# Patient Record
Sex: Female | Born: 1945
Health system: Southern US, Community
[De-identification: ages and names within clinical notes are randomized; demographics above are authoritative.]

## PROBLEM LIST (undated history)

## (undated) DIAGNOSIS — T7840XA Allergy, unspecified, initial encounter: Secondary | ICD-10-CM

## (undated) HISTORY — DX: Allergy, unspecified, initial encounter: T78.40XA

## (undated) HISTORY — PX: TUBAL LIGATION: SHX77

---

## 2013-11-08 ENCOUNTER — Ambulatory Visit (INDEPENDENT_AMBULATORY_CARE_PROVIDER_SITE_OTHER): Payer: Medicare Other | Admitting: Family Medicine

## 2013-11-08 VITALS — BP 130/90 | HR 66 | Temp 98.5°F | Resp 16 | Ht 63.0 in | Wt 117.8 lb

## 2013-11-08 DIAGNOSIS — L02419 Cutaneous abscess of limb, unspecified: Secondary | ICD-10-CM

## 2013-11-08 DIAGNOSIS — L03115 Cellulitis of right lower limb: Secondary | ICD-10-CM

## 2013-11-08 DIAGNOSIS — L03119 Cellulitis of unspecified part of limb: Secondary | ICD-10-CM | POA: Diagnosis not present

## 2013-11-08 MED ORDER — MUPIROCIN 2 % EX OINT: TOPICAL_OINTMENT | CUTANEOUS | Status: DC

## 2013-11-08 NOTE — Progress Notes (Signed)
Chief Complaint:  Chief Complaint  Patient presents with  . Leg Injury    Fell at home on the floor 4 weeks ago, still painful   . Rash    Used Betadine around the wound, a rash appeared afterwards    HPI: Patricia Maynard is a 68 y.o. female who is here for  Right leg wound, fell when she was bringing groceries into her house, and wound has not healed 4 weeks ago. She plucked her leg hair and those places turned "fire red". She has put neosporin.  She has had no fevers or chills. She has been weightbearing. She has no numbness or tingling. No diabetes. No weakness, tingling. Has been walking on it.   Past Medical History  Diagnosis Date  . Allergy    Past Surgical History  Procedure Laterality Date  . Tubal ligation     History   Social History  . Marital Status: Divorced    Spouse Name: N/A    Number of Children: N/A  . Years of Education: N/A   Social History Main Topics  . Smoking status: Current Every Day Smoker  . Smokeless tobacco: None  . Alcohol Use: No  . Drug Use: No  . Sexual Activity: None   Other Topics Concern  . None   Social History Narrative  . None   Family History  Problem Relation Age of Onset  . Hyperlipidemia Mother   . Hypertension Mother   . Stroke Mother    No Known Allergies Prior to Admission medications   Medication Sig Start Date End Date Taking? Authorizing Provider  loratadine (CLARITIN) 10 MG tablet Take 10 mg by mouth daily.   Yes Historical Provider, MD  phenylephrine (SUDAFED PE) 10 MG TABS tablet Take 10 mg by mouth every 4 (four) hours as needed.   Yes Historical Provider, MD     ROS: The patient denies fevers, chills, night sweats, unintentional weight loss, chest pain, palpitations, wheezing, dyspnea on exertion, nausea, vomiting, abdominal pain, dysuria, hematuria, melena, numbness, weakness, or tingling.   All other systems have been reviewed and were otherwise negative with the exception of those mentioned in  the HPI and as above.    PHYSICAL EXAM: Filed Vitals:   11/08/13 1751  BP: 130/90  Pulse: 66  Temp: 98.5 F (36.9 C)  Resp: 16   Filed Vitals:   11/08/13 1751  Height: 5\' 3"  (1.6 m)  Weight: 117 lb 12.8 oz (53.434 kg)   Body mass index is 20.87 kg/(m^2).  General: Alert, no acute distress HEENT:  Normocephalic, atraumatic, oropharynx patent. EOMI, PERRLA Cardiovascular:  Regular rate and rhythm, no rubs murmurs or gallops.  No Carotid bruits, radial pulse intact. No pedal edema.  Respiratory: Clear to auscultation bilaterally.  No wheezes, rales, or rhonchi.  No cyanosis, no use of accessory musculature GI: No organomegaly, abdomen is soft and non-tender, positive bowel sounds.  No masses. Skin: + localized cellulitic rash. Neurologic: Facial musculature symmetric. Psychiatric: Patient is appropriate throughout our interaction. Lymphatic: No cervical lymphadenopathy Musculoskeletal: Gait intact.   LABS: No results found for this or any previous visit.   EKG/XRAY:   Primary read interpreted by Dr. Conley Rolls at [facility].   ASSESSMENT/PLAN: Encounter Diagnosis  Name Primary?  . Cellulitis of right lower extremity Yes   Rx bactroban, Nonadhesive dressing Curlex dressing  No adhesive tape on skin , if worsening symptoms then will call me for oral abx F/u prn   Gross sideeffects,  risk and benefits, and alternatives of medications d/w patient. Patient is aware that all medications have potential sideeffects and we are unable to predict every sideeffect or drug-drug interaction that may occur.  Rakwon Letourneau PHUONG, DO 11/08/2013 7:04 PM

## 2014-01-27 DIAGNOSIS — Z23 Encounter for immunization: Secondary | ICD-10-CM | POA: Diagnosis not present

## 2014-08-02 ENCOUNTER — Ambulatory Visit (INDEPENDENT_AMBULATORY_CARE_PROVIDER_SITE_OTHER): Payer: Medicare Other | Admitting: Emergency Medicine

## 2014-08-02 VITALS — BP 160/100 | HR 88 | Temp 98.0°F | Resp 17 | Ht 63.0 in | Wt 120.0 lb

## 2014-08-02 DIAGNOSIS — K029 Dental caries, unspecified: Secondary | ICD-10-CM

## 2014-08-02 MED ORDER — CEPHALEXIN 500 MG PO CAPS: 500.0000 mg | ORAL_CAPSULE | Freq: Four times a day (QID) | ORAL | Status: DC

## 2014-08-02 NOTE — Progress Notes (Signed)
[facility] [address], [place] [id number] [phone]  Date:  08/02/2014   Name:  Patricia BakeBrenda Maynard   DOB:  1946/04/12   MRN:  [id number]  PCP:  No PCP Per Patient    Chief Complaint: Dental Pain; Hand Injury; and Sinusitis   History of Present Illness:  Patricia Maynard is a 69 y.o. very pleasant female patient who presents with the following:  History of dental extraction of #29 and #4. Had a restoration of adjacent tooth. Now has lost the filling on #4 This occurred this week.  Has pain i mouth and thermal sensitivity Bad taste in mouth No nasal drainage or post nasal drip Ho cough, wheezing or shortness of breath No fever or chills Numerous scratches on both hands.  Now puffy Current on TD No improvement with over the counter medications or other home remedies.  Denies other complaint or health concern today.    There are no active problems to display for this patient.   Past Medical History  Diagnosis Date  . Allergy     Past Surgical History  Procedure Laterality Date  . Tubal ligation      History  Substance Use Topics  . Smoking status: Current Every Day Smoker -- 0.75 packs/day for 50 years    Types: Cigarettes  . Smokeless tobacco: Not on file  . Alcohol Use: No    Family History  Problem Relation Age of Onset  . Hyperlipidemia Mother   . Hypertension Mother   . Stroke Mother     No Known Allergies  Medication list has been reviewed and updated.  Current Outpatient Prescriptions on File Prior to Visit  Medication Sig Dispense Refill  . loratadine (CLARITIN) 10 MG tablet Take 10 mg by mouth daily.    . phenylephrine (SUDAFED PE) 10 MG TABS tablet Take 10 mg by mouth every 4 (four) hours as needed.     No current facility-administered medications on file prior to visit.    Review of Systems:  As per HPI, otherwise negative.    Physical Examination: Filed Vitals:   08/02/14 1513  BP: 160/100  Pulse: 88  Temp: 98  F (36.7 C)  Resp: 17   Filed Vitals:   08/02/14 1513  Height: 5\' 3"  (1.6 m)  Weight: 120 lb (54.432 kg)   Body mass index is 21.26 kg/(m^2). Ideal Body Weight: Weight in (lb) to have BMI = 25: 140.8  GEN: WDWN, NAD, Non-toxic, A & O x 3 HEENT: Atraumatic, Normocephalic. Neck supple. No masses, No LAD.  Badly decayed #4 with lost filling. Ears and Nose: No external deformity. CV: RRR, No M/G/R. No JVD. No thrill. No extra heart sounds. PULM: CTA B, no wheezes, crackles, rhonchi. No retractions. No resp. distress. No accessory muscle use. ABD: S, NT, ND, +BS. No rebound. No HSM. EXTR: No c/c/e NEURO Normal gait.  PSYCH: Normally interactive. Conversant. Not depressed or anxious appearing.  Calm demeanor.    Assessment and Plan: Lost filling Keflex Dentist Monday  Signed,  Phillips OdorJeffery Vanity Larsson, MD

## 2014-08-02 NOTE — Patient Instructions (Signed)
Abscessed Tooth An abscessed tooth is an infection around your tooth. It may be caused by holes or damage to the tooth (cavity) or a dental disease. An abscessed tooth causes mild to very bad pain in and around the tooth. See your dentist right away if you have tooth or gum pain. HOME CARE  Take your medicine as told. Finish it even if you start to feel better.  Do not drive after taking pain medicine.  Rinse your mouth (gargle) often with salt water ( teaspoon salt in 8 ounces of warm water).  Do not apply heat to the outside of your face. GET HELP RIGHT AWAY IF:   You have a temperature by mouth above 102 F (38.9 C), not controlled by medicine.  You have chills and a very bad headache.  You have problems breathing or swallowing.  Your mouth will not open.  You develop puffiness (swelling) on the neck or around the eye.  Your pain is not helped by medicine.  Your pain is getting worse instead of better. MAKE SURE YOU:   Understand these instructions.  Will watch your condition.  Will get help right away if you are not doing well or get worse. Document Released: 09/22/2007 Document Revised: 06/28/2011 Document Reviewed: 07/14/2010 Exit[employer] Patient Information 2015 [employer]. This information is not intended to replace advice given to you by your health care provider. Make sure you discuss any questions you have with your health care provider.  

## 2015-01-05 DIAGNOSIS — Z23 Encounter for immunization: Secondary | ICD-10-CM | POA: Diagnosis not present

## 2015-12-15 DIAGNOSIS — Z23 Encounter for immunization: Secondary | ICD-10-CM | POA: Diagnosis not present

## 2017-01-11 DIAGNOSIS — Z23 Encounter for immunization: Secondary | ICD-10-CM | POA: Diagnosis not present

## 2017-12-20 DIAGNOSIS — Z23 Encounter for immunization: Secondary | ICD-10-CM | POA: Diagnosis not present

## 2018-06-26 ENCOUNTER — Emergency Department (HOSPITAL_COMMUNITY)
Admission: EM | Admit: 2018-06-26 | Discharge: 2018-06-27 | Disposition: A | Discharge status: Home / Self Care | Payer: Medicare Other | Attending: Emergency Medicine | Admitting: Emergency Medicine

## 2018-06-26 ENCOUNTER — Other Ambulatory Visit: Payer: Self-pay

## 2018-06-26 ENCOUNTER — Encounter (HOSPITAL_COMMUNITY): Payer: Self-pay

## 2018-06-26 ENCOUNTER — Emergency Department (HOSPITAL_COMMUNITY): Payer: Medicare Other

## 2018-06-26 DIAGNOSIS — H353132 Nonexudative age-related macular degeneration, bilateral, intermediate dry stage: Secondary | ICD-10-CM | POA: Diagnosis not present

## 2018-06-26 DIAGNOSIS — R030 Elevated blood-pressure reading, without diagnosis of hypertension: Secondary | ICD-10-CM | POA: Diagnosis not present

## 2018-06-26 DIAGNOSIS — F17210 Nicotine dependence, cigarettes, uncomplicated: Secondary | ICD-10-CM | POA: Diagnosis not present

## 2018-06-26 DIAGNOSIS — H532 Diplopia: Secondary | ICD-10-CM | POA: Diagnosis not present

## 2018-06-26 DIAGNOSIS — H4923 Sixth [abducent] nerve palsy, bilateral: Secondary | ICD-10-CM | POA: Diagnosis not present

## 2018-06-26 LAB — COMPREHENSIVE METABOLIC PANEL
ALT: 16 U/L (ref 0–44)
AST: 27 U/L (ref 15–41)
Albumin: 4.1 g/dL (ref 3.5–5.0)
Alkaline Phosphatase: 68 U/L (ref 38–126)
Anion gap: 9 (ref 5–15)
BUN: 25 mg/dL — ABNORMAL HIGH (ref 8–23)
CHLORIDE: 106 mmol/L (ref 98–111)
CO2: 23 mmol/L (ref 22–32)
Calcium: 9.4 mg/dL (ref 8.9–10.3)
Creatinine, Ser: 0.72 mg/dL (ref 0.44–1.00)
GFR calc non Af Amer: >60 mL/min (ref >60)
Glucose, Bld: 118 mg/dL — ABNORMAL HIGH (ref 70–99)
Potassium: 3.9 mmol/L (ref 3.5–5.1)
Sodium: 138 mmol/L (ref 135–145)
Total Bilirubin: 0.8 mg/dL (ref 0.3–1.2)
Total Protein: 6.8 g/dL (ref 6.5–8.1)

## 2018-06-26 LAB — APTT: APTT: 33 s (ref 24–36)

## 2018-06-26 LAB — POCT I-STAT EG7
ACID-BASE EXCESS: 2 mmol/L (ref 0.0–2.0)
Bicarbonate: 25.8 mmol/L (ref 20.0–28.0)
CALCIUM ION: 1.12 mmol/L — AB (ref 1.15–1.40)
HCT: 41 % (ref 36.0–46.0)
Hemoglobin: 13.9 g/dL (ref 12.0–15.0)
O2 SAT: 99 %
PH VEN: 7.473 — AB (ref 7.250–7.430)
Potassium: 4 mmol/L (ref 3.5–5.1)
Sodium: 139 mmol/L (ref 135–145)
TCO2: 27 mmol/L (ref 22–32)
pCO2, Ven: 35.3 mmHg — ABNORMAL LOW (ref 44.0–60.0)
pO2, Ven: 149 mmHg — ABNORMAL HIGH (ref 32.0–45.0)

## 2018-06-26 LAB — CBC
HCT: 41.4 % (ref 36.0–46.0)
Hemoglobin: 14 g/dL (ref 12.0–15.0)
MCH: 32.5 pg (ref 26.0–34.0)
MCHC: 33.8 g/dL (ref 30.0–36.0)
MCV: 96.1 fL (ref 80.0–100.0)
Platelets: 298 10*3/uL (ref 150–400)
RBC: 4.31 MIL/uL (ref 3.87–5.11)
RDW: 12.4 % (ref 11.5–15.5)
WBC: 8.4 10*3/uL (ref 4.0–10.5)
nRBC: 0 % (ref 0.0–0.2)

## 2018-06-26 LAB — DIFFERENTIAL
Abs Immature Granulocytes: 0.01 10*3/uL (ref 0.00–0.07)
BASOS PCT: 1 %
Basophils Absolute: 0 10*3/uL (ref 0.0–0.1)
Eosinophils Absolute: 0.1 10*3/uL (ref 0.0–0.5)
Eosinophils Relative: 1 %
Immature Granulocytes: 0 %
Lymphocytes Relative: 18 %
Lymphs Abs: 1.5 10*3/uL (ref 0.7–4.0)
Monocytes Absolute: 0.6 10*3/uL (ref 0.1–1.0)
Monocytes Relative: 7 %
NEUTROS PCT: 73 %
Neutro Abs: 6.2 10*3/uL (ref 1.7–7.7)

## 2018-06-26 LAB — I-STAT CREATININE, ED: Creatinine, Ser: 0.7 mg/dL (ref 0.44–1.00)

## 2018-06-26 LAB — PROTIME-INR
INR: 1 (ref 0.8–1.2)
Prothrombin Time: 13.4 seconds (ref 11.4–15.2)

## 2018-06-26 NOTE — ED Triage Notes (Signed)
Pt arrives POV for eval of double vision x 3 weeks. Seen at opthamologist office today and MD noted cranial nerve VI deficit which he believed could be attributed to CVA vs MG. Pt sent here for further eval. Pt reports no new deficits in last 2 weeks aside from double vision, neuro intact w/ no unilateral deficits weakness or numbness. GCS 15, denies pain.

## 2018-06-27 ENCOUNTER — Emergency Department (HOSPITAL_COMMUNITY): Payer: Medicare Other

## 2018-06-27 DIAGNOSIS — H532 Diplopia: Secondary | ICD-10-CM | POA: Diagnosis not present

## 2018-06-27 LAB — URINALYSIS, ROUTINE W REFLEX MICROSCOPIC
Bilirubin Urine: NEGATIVE
Glucose, UA: NEGATIVE mg/dL
Hgb urine dipstick: NEGATIVE
Ketones, ur: 5 mg/dL — AB
Nitrite: NEGATIVE
Protein, ur: NEGATIVE mg/dL
Specific Gravity, Urine: 1.029 (ref 1.005–1.030)
pH: 5 (ref 5.0–8.0)

## 2018-06-27 MED ORDER — LORAZEPAM 2 MG/ML IJ SOLN
0.5000 mg | Freq: Once | INTRAMUSCULAR | Status: AC
  Administered 2018-06-27: 0.5 mg via INTRAVENOUS
  Filled 2018-06-27: qty 1

## 2018-06-27 MED ORDER — GADOBUTROL 1 MMOL/ML IV SOLN
6.0000 mL | Freq: Once | INTRAVENOUS | Status: AC | PRN
  Administered 2018-06-27: 6 mL via INTRAVENOUS

## 2018-06-27 NOTE — ED Notes (Signed)
Pt returned from MRI °

## 2018-06-27 NOTE — ED Provider Notes (Signed)
[facility] Emergency Department Provider Note MRN:  [id number]  Arrival date & time: 06/27/18     Chief Complaint   Diplopia   History of Present Illness   Patricia Maynard is a 73 y.o. year-old female with no significant past medical history presenting to the ED with chief complaint of double vision.  1 to 2 weeks of persistent intermittent double vision.  Patient explains that it is present more often than it is not.  Patient has been struggling with worsening memory issues for months to years.  Seen by an optometrist and then an ophthalmologist, reportedly diagnosed with a 6th nerve palsy, sent here for further evaluation.  Patient denies headache, no slurred speech, no increased confusion, no chest pain or shortness of breath, no abdominal pain, no numbness or weakness to the arms or legs.  Review of Systems  A complete 10 system review of systems was obtained and all systems are negative except as noted in the HPI and PMH.   Patient's Health History    Past Medical History:  Diagnosis Date  . Allergy     Past Surgical History:  Procedure Laterality Date  . TUBAL LIGATION      Family History  Problem Relation Age of Onset  . Hyperlipidemia Mother   . Hypertension Mother   . Stroke Mother     Social History   Socioeconomic History  . Marital status: Divorced    Spouse name: Not on file  . Number of children: Not on file  . Years of education: Not on file  . Highest education level: Not on file  Occupational History  . Not on file  Social Needs  . Financial resource strain: Not on file  . Food insecurity:    Worry: Not on file    Inability: Not on file  . Transportation needs:    Medical: Not on file    Non-medical: Not on file  Tobacco Use  . Smoking status: Current Every Day Smoker    Packs/day: 0.75    Years: 50.00    Pack years: 37.50    Types: Cigarettes  Substance and Sexual Activity  . Alcohol use: No    Alcohol/week: 0.0 standard  drinks  . Drug use: No  . Sexual activity: Not on file  Lifestyle  . Physical activity:    Days per week: Not on file    Minutes per session: Not on file  . Stress: Not on file  Relationships  . Social connections:    Talks on phone: Not on file    Gets together: Not on file    Attends religious service: Not on file    Active member of club or organization: Not on file    Attends meetings of clubs or organizations: Not on file    Relationship status: Not on file  . Intimate partner violence:    Fear of current or ex partner: Not on file    Emotionally abused: Not on file    Physically abused: Not on file    Forced sexual activity: Not on file  Other Topics Concern  . Not on file  Social History Narrative  . Not on file     Physical Exam  Vital Signs and Nursing Notes reviewed Vitals:   06/27/18 1219 06/27/18 1230  BP: (!) 158/88   Pulse: 60 63  Resp: 16 14  Temp:    SpO2: 95% 97%    CONSTITUTIONAL: Well-appearing, NAD NEURO:  Alert and oriented  x 3, normal and symmetric strength and sensation, normal coordination, normal extraocular movements, no visual field cuts, no aphasia, no neglect EYES:  eyes equal and reactive ENT/NECK:  no LAD, no JVD CARDIO: Regular rate, well-perfused, normal S1 and S2 PULM:  CTAB no wheezing or rhonchi GI/GU:  normal bowel sounds, non-distended, non-tender MSK/SPINE:  No gross deformities, no edema SKIN:  no rash, atraumatic PSYCH:  Appropriate speech and behavior  Diagnostic and Interventional Summary    EKG Interpretation  Date/Time:  Monday June 26 2018 19:58:38 EDT Ventricular Rate:  87 PR Interval:  142 QRS Duration: 80 QT Interval:  406 QTC Calculation: 488 R Axis:   94 Text Interpretation:  Normal sinus rhythm Rightward axis Nonspecific ST abnormality Abnormal ECG Confirmed by Kennis Carina [phone]) on 06/27/2018 11:52:34 AM      Labs Reviewed  COMPREHENSIVE METABOLIC PANEL - Abnormal; Notable for the following  components:      Result Value   Glucose, Bld 118 (*)    BUN 25 (*)    All other components within normal limits  URINALYSIS, ROUTINE W REFLEX MICROSCOPIC - Abnormal; Notable for the following components:   APPearance HAZY (*)    Ketones, ur 5 (*)    Leukocytes,Ua SMALL (*)    Bacteria, UA RARE (*)    All other components within normal limits  POCT I-STAT EG7 - Abnormal; Notable for the following components:   pH, Ven 7.473 (*)    pCO2, Ven 35.3 (*)    pO2, Ven 149.0 (*)    Calcium, Ion 1.12 (*)    All other components within normal limits  PROTIME-INR  APTT  CBC  DIFFERENTIAL  I-STAT CREATININE, ED    MR Brain W and Wo Contrast  Final Result    CT HEAD WO CONTRAST  Final Result      Medications  LORazepam (ATIVAN) injection 0.5 mg (0.5 mg Intravenous Given 06/27/18 1055)  gadobutrol (GADAVIST) 1 MMOL/ML injection 6 mL (6 mLs Intravenous Contrast Given 06/27/18 1215)     Procedures Critical Care  ED Course and Medical Decision Making  I have reviewed the triage vital signs and the nursing notes.  Pertinent labs & imaging results that were available during my care of the patient were reviewed by me and considered in my medical decision making (see below for details).  Intermittent double vision, question of cranial nerve VI palsy diagnosed by ophthalmology, normal vision and extraocular movements here, CT head noncontrast unremarkable, will consult neurology for recommendations with regard to more imaging.  Clinical Course as of Jun 26 1329  Tue Jun 27, 2018  0919 Neurology recommending MRI with and without contrast with thin cuts through the brainstem.   [MB]    Clinical Course User Index [MB] Sabas Sous, MD    MRI is unremarkable.  Upon reassessment patient continues to have normal neurological exam, asymptomatic during her extended ED visit.  Suspect primary ophthalmologic issue or possibly myasthenia gravis which was suggested by the ophthalmologist.   Referred to neurology for further care.  After the discussed management above, the patient was determined to be safe for discharge.  The patient was in agreement with this plan and all questions regarding their care were answered.  ED return precautions were discussed and the patient will return to the ED with any significant worsening of condition.  Elmer Sow. Pilar Plate, MD [facility] Emergency Medicine [facility] [email or web address]  Final Clinical Impressions(s) / ED Diagnoses     ICD-10-CM  1. Double vision H53.2     ED Discharge Orders    None         Sabas Sous, MD 06/27/18 1332

## 2018-06-27 NOTE — ED Notes (Signed)
Patient transported to MRI 

## 2018-06-27 NOTE — ED Notes (Signed)
Patient verbalizes understanding of discharge instructions. Opportunity for questioning and answers were provided. Pt discharged from ED. 

## 2018-06-27 NOTE — Discharge Instructions (Addendum)
You were evaluated in the Emergency Department and after careful evaluation, we did not find any emergent condition requiring admission or further testing in the hospital.  Your MRI today was very reassuring and did not show any signs of stroke.  Please follow-up with the neurologist for further management.  Please return to the Emergency Department if you experience any worsening of your condition.  We encourage you to follow up with a primary care provider.  Thank you for allowing [place] to be a part of your care.

## 2018-07-12 ENCOUNTER — Other Ambulatory Visit: Payer: Self-pay

## 2018-07-12 ENCOUNTER — Ambulatory Visit (INDEPENDENT_AMBULATORY_CARE_PROVIDER_SITE_OTHER): Payer: Medicare Other | Admitting: Neurology

## 2018-07-12 ENCOUNTER — Encounter: Payer: Self-pay | Admitting: Neurology

## 2018-07-12 ENCOUNTER — Other Ambulatory Visit (INDEPENDENT_AMBULATORY_CARE_PROVIDER_SITE_OTHER): Payer: Medicare Other

## 2018-07-12 VITALS — BP 110/70 | HR 56 | Temp 97.8°F | Ht 63.5 in | Wt 125.0 lb

## 2018-07-12 DIAGNOSIS — F17210 Nicotine dependence, cigarettes, uncomplicated: Secondary | ICD-10-CM

## 2018-07-12 DIAGNOSIS — R413 Other amnesia: Secondary | ICD-10-CM | POA: Diagnosis not present

## 2018-07-12 DIAGNOSIS — R6889 Other general symptoms and signs: Secondary | ICD-10-CM

## 2018-07-12 DIAGNOSIS — H532 Diplopia: Secondary | ICD-10-CM

## 2018-07-12 DIAGNOSIS — R739 Hyperglycemia, unspecified: Secondary | ICD-10-CM | POA: Diagnosis not present

## 2018-07-12 DIAGNOSIS — Z720 Tobacco use: Secondary | ICD-10-CM | POA: Diagnosis not present

## 2018-07-12 DIAGNOSIS — Z131 Encounter for screening for diabetes mellitus: Secondary | ICD-10-CM

## 2018-07-12 LAB — TSH: TSH: 2.45 u[IU]/mL (ref 0.35–4.50)

## 2018-07-12 LAB — VITAMIN B12: Vitamin B-12: 785 pg/mL (ref 211–911)

## 2018-07-12 LAB — HEMOGLOBIN A1C: Hgb A1c MFr Bld: 5.9 % (ref 4.6–6.5)

## 2018-07-12 NOTE — Progress Notes (Signed)
[facility] Neu[facility] Clinic Note - Initial Visit   Date: 07/12/18  Patricia Maynard MRN: [id number] DOB: 1945-08-27   Dear Dr. Pilar Plate:   Thank you for your kind referral of Patricia Maynard for consultation of double vision. Although her history is well known to you, please allow [place] to reiterate it for the purpose of our medical record. The patient was accompanied to the clinic by self.    History of Present Illness: Patricia Maynard is a 73 y.o. right-handed Caucasian female with tobacco abuse presenting for evaluation of double vision and memory loss.   Starting around December 2019, she began having double vision when seeing objects that are in the distance, described by side-by-side images. She does not have double vision when images are close up.  She does not droopiness of the eyes.  No difficulty with swallowing or talking.  There is inconsistency between patient's history and history from her ER visit on 06/27/2018 which reports 2-week history of intermittent double vision.  She was evaluated by optometrist and ophthalmologist and diagnosed with CN VI palsy, which patient denies.  I was able to request records from. Dr. Georges Mouse at [facility] on 3/9 whose evaluated indicates possible bilateral CN VI palsy.  In the ED, she underwent MRI brain which did not reveal structural pathology to explain her symptoms.  She has generalized atrophy of the brain.    She does not have primary care doctor and has not seen anyone in many years. .  She lives alone and claims to manage her own finances and household chores such as cooking, cleaning. Her son states that she has made errors overspending and overdrafts her account, so manages her finances.  He also told my nurse that she lived with her parents at the age of 52 because she could not "keep it together".  She does not take daily medications.  She was driving prior to having her double vision, but has stopped this.        Out-side paper records, electronic medical record, and images have been reviewed where available and summarized as:  MRI brain wwo contrast 06/27/2018: Advanced atrophy and small vessel disease. No acute stroke. No abnormal postcontrast enhancement. No structural abnormality affects the brainstem, skull base, or extracerebral CSF spaces along the course of the sixth cranial nerve. Unremarkable appearing orbits.  CT head 06/26/2018:   1. No acute intracranial abnormality identified. 2. Chronic microvascular ischemic changes and volume loss of the brain. Small chronic appearing infarct in left caudate head.   Past Medical History:  Diagnosis Date   Allergy     Past Surgical History:  Procedure Laterality Date   TUBAL LIGATION       Medications:  Outpatient Encounter Medications as of 07/12/2018  Medication Sig   acetaminophen (TYLENOL) 500 MG tablet Take 500 mg by mouth every 6 (six) hours as needed for mild pain, fever or headache.    loratadine (CLARITIN) 10 MG tablet Take 10 mg by mouth daily.   Multiple Vitamins-Minerals (PRESERVISION AREDS PO) Take by mouth.   No facility-administered encounter medications on file as of 07/12/2018.     Allergies: No Known Allergies  Family History: Family History  Problem Relation Age of Onset   Hyperlipidemia Mother    Hypertension Mother    Stroke Mother     Social History: Social History   Tobacco Use   Smoking status: Current Every Day Smoker    Packs/day: 0.75    Years: 55.00  Pack years: 41.25    Types: Cigarettes   Smokeless tobacco: Never Used  Substance Use Topics   Alcohol use: No    Alcohol/week: 0.0 standard drinks   Drug use: No   Social History   Social History Narrative   Lives alone in a 3 story home.  Has 4 children.  Retired.     Education: college.     Review of Systems:  CONSTITUTIONAL: No fevers, chills, night sweats, or weight loss.   EYES: No visual changes or eye pain ENT: No  hearing changes.  No history of nose bleeds.   RESPIRATORY: No cough, wheezing and shortness of breath.   CARDIOVASCULAR: Negative for chest pain, and palpitations.   GI: Negative for abdominal discomfort, blood in stools or black stools.  No recent change in bowel habits.   GU:  No history of incontinence.   MUSCLOSKELETAL: No history of joint pain or swelling.  No myalgias.   SKIN: Negative for lesions, rash, and itching.   HEMATOLOGY/ONCOLOGY: Negative for prolonged bleeding, bruising easily, and swollen nodes.  No history of cancer.   ENDOCRINE: Negative for cold or heat intolerance, polydipsia or goiter.   PSYCH:  No depression or anxiety symptoms.   NEURO: As Above.   Vital Signs:  BP 110/70    Pulse (!) 56    Temp 97.8 F (36.6 C) (Oral)    Ht 5' 3.5" (1.613 m)    Wt 125 lb (56.7 kg)    SpO2 97%    BMI 21.80 kg/m    General Medical Exam:   General:  Well appearing, comfortable.   Eyes/ENT: see cranial nerve examination.   Neck:   No carotid bruits. Respiratory:  Clear to auscultation, good air entry bilaterally.   Cardiac:  Regular rate and rhythm, no murmur.   Extremities:  No deformities, edema, or skin discoloration.  Skin:  No rashes or lesions.  Neurological Exam: MENTAL STATUS including orientation to time, place, person, recent and remote memory, attention span and concentration, language, and fund of knowledge is fair. Speech is not dysarthric. Montreal Cognitive Assessment  07/12/2018  Visuospatial/ Executive (0/5) 2  Naming (0/3) 3  Attention: Read list of digits (0/2) 2  Attention: Read list of letters (0/1) 0  Attention: Serial 7 subtraction starting at 100 (0/3) 1  Language: Repeat phrase (0/2) 1  Language : Fluency (0/1) 0  Abstraction (0/2) 0  Delayed Recall (0/5) 0  Orientation (0/6) 2  Total 11  Adjusted Score (based on education) 11   CRANIAL NERVES: II:  No visual field defects.  Unremarkable fundi.   III-IV-VI: Pupils equal round and reactive to  light.  Left lateral rectus palsy, otherwise conjugate, extra-ocular eye movements in all directions of gaze.  No nystagmus.  No ptosis at rest or with sustained upgaze.   V:  Normal facial sensation.    VII:  Normal facial symmetry and movements.   VIII:  Normal hearing and vestibular function.   IX-X:  Normal palatal movement.   XI:  Normal shoulder shrug and head rotation.   XII:  Normal tongue strength and range of motion, no deviation or fasciculation.  MOTOR: No fatigability.  No atrophy, fasciculations or abnormal movements.  No pronator drift.   Upper Extremity:  Right  Left  Deltoid  5/5   5/5   Biceps  5/5   5/5   Triceps  5/5   5/5   Infraspinatus 5/5  5/5  Medial pectoralis 5/5  5/5  Wrist extensors  5/5   5/5   Wrist flexors  5/5   5/5   Finger extensors  5/5   5/5   Finger flexors  5/5   5/5   Dorsal interossei  5/5   5/5   Abductor pollicis  5/5   5/5   Tone (Ashworth scale)  0  0   Lower Extremity:  Right  Left  Hip flexors  5/5   5/5   Hip extensors  5/5   5/5   Adductor 5/5  5/5  Abductor 5/5  5/5  Knee flexors  5/5   5/5   Knee extensors  5/5   5/5   Dorsiflexors  5/5   5/5   Plantarflexors  5/5   5/5   Toe extensors  5/5   5/5   Toe flexors  5/5   5/5   Tone (Ashworth scale)  0  0   MSRs:  Right        Left                  brachioradialis 2+  2+  biceps 2+  2+  triceps 2+  2+  patellar 2+  2+  ankle jerk 2+  2+  Hoffman no  no  plantar response down  down   SENSORY:  Normal and symmetric perception of light touch, pinprick, vibration.  COORDINATION/GAIT: Normal finger-to- nose-finger.  Intact rapid alternating movements bilaterally.  Able to rise from a chair without using arms.  Gait narrow based and stable.   IMPRESSION: 1.  Left abducens nerve palsy.  She tracks well with right gaze and I cannot appreciate lateral rectus weakness on the right.  MRI brain does not disclose structural pathology to explain this.  I will check labs to determine  whether this is due to myasthenia gravis, thyroid dysfunction, or diabetes.  If antibodies are positive, I will offer trial of mestinon.   2.  Cognitive impairment with poor performance on cognitive screening (11/30). It seems like she may also have some underlying behavior issues, raising concern for frontotemporal dementia.   MRI brain showed generalized atrophy, without preferential involvement of frontal/temporal lobes. Recommend formal neurocognitive testing to better characterize these changes.    3. Tobacco abuse.  She is smoking 0.75 PPD x 55 years.  She does not express interest in quitting.  Patient was informed of the dangers of tobacco abuse including stroke, cancer, and MI, as well as benefits of tobacco cessation.  Approximately 5 mins were spent counseling patient cessation techniques. We discussed various methods to help quit smoking, including deciding on a date to quit, joining a support group, pharmacological agents- nicotine gum/patch/lozenges, chantix. I will reassess her progress at the next follow-up visit   PLAN/RECOMMENDATIONS:  Check myasthenia gravis panel, TSH, HbA1c Formal neurocognitive testing Counseled on tobacco cessation  Establish care with PCP to maintain preventative care  Further recommendations pending results.   Thank you for allowing me to participate in patient's care.  If I can answer any additional questions, I would be pleased to do so.    Sincerely,    Eria Lozoya K. Allena Katz, DO

## 2018-07-12 NOTE — Patient Instructions (Addendum)
Check labs.  We will call you with the results  It is very important that you stop smoking  Please establish care with primary care doctor for regular preventative medical care

## 2018-07-18 LAB — MYASTHENIA GRAVIS PANEL 2
A CHR BINDING ABS: <0.3 nmol/L
ACHR Blocking Abs: <15 % Inhibition (ref <15)
Acetylchol Modul Ab: 20 % Inhibition

## 2018-07-20 ENCOUNTER — Other Ambulatory Visit: Payer: Self-pay | Admitting: *Deleted

## 2018-07-20 DIAGNOSIS — R413 Other amnesia: Secondary | ICD-10-CM

## 2018-07-21 ENCOUNTER — Telehealth: Payer: Self-pay | Admitting: *Deleted

## 2018-07-21 NOTE — Telephone Encounter (Signed)
-----   Message from Glendale Chard, DO sent at 07/20/2018 11:32 AM EDT ----- Please inform patient that her labs are normal.  Recommend follow-up with eye doctor for prisms to help with double vision.  Also, please refer patient for neurocognitive testing for evaluation of memory complaints.  She will need her son to drive her to [place] or she can wait until we are scheduling here in late summer. Thanks

## 2018-07-21 NOTE — Telephone Encounter (Signed)
Left message for patient's son to call me back.

## 2018-07-26 ENCOUNTER — Other Ambulatory Visit: Payer: Self-pay | Admitting: *Deleted

## 2018-07-26 ENCOUNTER — Telehealth: Payer: Self-pay | Admitting: *Deleted

## 2018-07-26 ENCOUNTER — Encounter: Payer: Self-pay | Admitting: *Deleted

## 2018-07-26 DIAGNOSIS — R413 Other amnesia: Secondary | ICD-10-CM

## 2018-07-26 NOTE — Progress Notes (Unsigned)
amg

## 2018-07-26 NOTE — Telephone Encounter (Signed)
-----   Message from Glendale Chard, DO sent at 07/20/2018 11:32 AM EDT ----- Please inform patient that her labs are normal.  Recommend follow-up with eye doctor for prisms to help with double vision.  Also, please refer patient for neurocognitive testing for evaluation of memory complaints.  She will need her son to drive her to [place] or she can wait until we are scheduling here in late summer. Thanks

## 2018-07-26 NOTE — Telephone Encounter (Signed)
No reply from patient.  I will send a letter.

## 2018-08-29 ENCOUNTER — Telehealth: Payer: Self-pay | Admitting: Neurology

## 2018-08-29 NOTE — Telephone Encounter (Signed)
Son called wanting to get test results and waiting on various referrals for treatments. Please Call. Thanks

## 2018-08-30 NOTE — Telephone Encounter (Signed)
If this patient's son calls back will you please relay the message in the last telephone note.

## 2018-08-30 NOTE — Telephone Encounter (Signed)
Left message for patient's son to call me back.

## 2019-06-11 ENCOUNTER — Encounter: Payer: Medicare Other | Admitting: Psychology

## 2019-08-30 DIAGNOSIS — S20212A Contusion of left front wall of thorax, initial encounter: Secondary | ICD-10-CM | POA: Diagnosis not present

## 2019-08-30 DIAGNOSIS — I499 Cardiac arrhythmia, unspecified: Secondary | ICD-10-CM | POA: Diagnosis not present

## 2019-10-31 ENCOUNTER — Other Ambulatory Visit: Payer: Self-pay

## 2019-10-31 ENCOUNTER — Encounter: Payer: Self-pay | Admitting: Physician Assistant

## 2019-10-31 ENCOUNTER — Ambulatory Visit (INDEPENDENT_AMBULATORY_CARE_PROVIDER_SITE_OTHER): Payer: Medicare Other | Admitting: Physician Assistant

## 2019-10-31 VITALS — BP 110/80 | HR 64 | Temp 98.3°F | Resp 14 | Ht 61.0 in | Wt 127.0 lb

## 2019-10-31 DIAGNOSIS — F0390 Unspecified dementia without behavioral disturbance: Secondary | ICD-10-CM

## 2019-10-31 DIAGNOSIS — Z8639 Personal history of other endocrine, nutritional and metabolic disease: Secondary | ICD-10-CM | POA: Diagnosis not present

## 2019-10-31 NOTE — Patient Instructions (Addendum)
Please go to the lab today for blood work.  I will call you with your results. We will alter treatment regimen(s) if indicated by your results.   We can decide on next steps once we get those results. I do want to set her back up with neurology for further monitoring of this progressive dementia.  I would recommend we consider starting Aricept and possibly namenda as well to help try to slow down progression as much as possible. Please talk about this with your husband and brother-in-law so we can get things started.   It was very nice meeting you today. Welcome to [facility]!

## 2019-10-31 NOTE — Progress Notes (Signed)
Patient presents to clinic today with her family to establish care. Note it has been years since she has had a primary care provider. Note that patient started having mild issues with memory a few years ago that have progressed, especially over the past year. Patient is typically oriented to herself and family members but otherwise does not know the day, year, of where she is at. Notes seeing neurology a few years ago when things were starting but did not have follow-up. As such no formal diagnosis or treatment was started. Daughter notes family history of alzheimer's. Note patient does live at home but has a daytime home health aid and the children rotate spending evenings/nights with her. Has not driven in 2 years -- she has not current access to automobile. She does not cook. They have gotten her an ID bracelet and have planned on getting her moved into a memory care unit. Patient and family deny any known history of CAD, MI, CVA. Deny any known history of HTN or DM. Think maybe she had cholesterol at one point in time. They are unsure of her preventive care -- unknown if she has had colonoscopy, mammogram, etc in the past.   Past Medical History:  Diagnosis Date  . Allergy     Past Surgical History:  Procedure Laterality Date  . TUBAL LIGATION      No current outpatient medications on file prior to visit.   No current facility-administered medications on file prior to visit.    No Known Allergies  Family History  Problem Relation Age of Onset  . Hyperlipidemia Mother   . Hypertension Mother   . Stroke Mother     Social History   Socioeconomic History  . Marital status: Divorced    Spouse name: Not on file  . Number of children: 4  . Years of education: 16  . Highest education level: Bachelor's degree (e.g., BA, AB, BS)  Occupational History  . Occupation: retired  Tobacco Use  . Smoking status: Former Smoker    Packs/day: 0.75    Years: 55.00    Pack years: 41.25     Types: Cigarettes  . Smokeless tobacco: Never Used  Vaping Use  . Vaping Use: Never used  Substance and Sexual Activity  . Alcohol use: No    Alcohol/week: 0.0 standard drinks  . Drug use: No  . Sexual activity: Not Currently  Other Topics Concern  . Not on file  Social History Narrative   Lives alone in a 3 story home.  Has 4 children.  Retired.     Education: college.    Social Determinants of Health   Financial Resource Strain:   . Difficulty of Paying Living Expenses:   Food Insecurity:   . Worried About Charity fundraiser in the Last Year:   . Arboriculturist in the Last Year:   Transportation Needs:   . Film/video editor (Medical):   Marland Kitchen Lack of Transportation (Non-Medical):   Physical Activity:   . Days of Exercise per Week:   . Minutes of Exercise per Session:   Stress:   . Feeling of Stress :   Social Connections:   . Frequency of Communication with Friends and Family:   . Frequency of Social Gatherings with Friends and Family:   . Attends Religious Services:   . Active Member of Clubs or Organizations:   . Attends Archivist Meetings:   Marland Kitchen Marital Status:   Intimate Production manager  Violence:   . Fear of Current or Ex-Partner:   . Emotionally Abused:   Marland Kitchen Physically Abused:   . Sexually Abused:    ROS Unable to obtain ROS due to dementia.   BP 110/80   Pulse 64   Temp 98.3 F (36.8 C) (Temporal)   Resp 14   Ht _0  (1.549 m)   Wt 127 lb (57.6 kg)   SpO2 98%   BMI 24.00 kg/m   Physical Exam Vitals reviewed.  Constitutional:      Appearance: Normal appearance.  HENT:     Head: Normocephalic and atraumatic.     Right Ear: Tympanic membrane normal.     Left Ear: Tympanic membrane normal.  Eyes:     Conjunctiva/sclera: Conjunctivae normal.     Pupils: Pupils are equal, round, and reactive to light.     Comments: Arcus senilus present bilaterally   Cardiovascular:     Rate and Rhythm: Normal rate and regular rhythm.     Pulses: Normal  pulses.     Heart sounds: Normal heart sounds.  Pulmonary:     Effort: Pulmonary effort is normal.     Breath sounds: Normal breath sounds.  Abdominal:     General: Bowel sounds are normal. There is no distension.     Palpations: Abdomen is soft. There is no mass.     Tenderness: There is no abdominal tenderness.     Hernia: No hernia is present.  Musculoskeletal:        General: Normal range of motion.     Cervical back: Neck supple.  Neurological:     Mental Status: She is alert.     Cranial Nerves: No cranial nerve deficit.     Motor: No weakness.     Coordination: Coordination normal.     Gait: Gait normal.     Deep Tendon Reflexes: Reflexes normal.     Comments: Alert to person but not place or time.  Very pleasantly demented. Sometimes responds very appropriately. Other times has tangential speech.  Mood is normal. Interacts appropriately with family.     Assessment/Plan: 1. Dementia without behavioral disturbance, unspecified dementia type ([place]) Advancing. Discussed need for Neurology monitoring. Will check baseline labs today. Would like to start Aricept but they want to discuss first. Exam overall unremarkable today except for memory. Referral to Neurology placed -- seen [facility] neuro in the past and can be scheduled there.  Can help with Fl2 forms for SNF if they are wanting to proceed with this before her neuro appt.  - CBC w/Diff - Comp Met (CMET) - B12 - TSH  2. History of hyperlipidemia Will check lipids panel today at family's preference for baseline. Good diet overall. Would not start statin giving advanced dementia.  - Lipid Profile  This visit occurred during the SARS-CoV-2 public health emergency.  Safety protocols were in place, including screening questions prior to the visit, additional usage of staff PPE, and extensive cleaning of exam room while observing appropriate contact time as indicated for disinfecting solutions.      Leeanne Rio, PA-C

## 2019-11-01 LAB — CBC WITH DIFFERENTIAL/PLATELET
Basophils Absolute: 0.1 10*3/uL (ref 0.0–0.1)
Basophils Relative: 1.4 % (ref 0.0–3.0)
Eosinophils Absolute: 0.2 10*3/uL (ref 0.0–0.7)
Eosinophils Relative: 2.5 % (ref 0.0–5.0)
HCT: 40.2 % (ref 36.0–46.0)
Hemoglobin: 13.5 g/dL (ref 12.0–15.0)
Lymphocytes Relative: 18.1 % (ref 12.0–46.0)
Lymphs Abs: 1.2 10*3/uL (ref 0.7–4.0)
MCHC: 33.6 g/dL (ref 30.0–36.0)
MCV: 96.9 fl (ref 78.0–100.0)
Monocytes Absolute: 0.7 10*3/uL (ref 0.1–1.0)
Monocytes Relative: 10.4 % (ref 3.0–12.0)
Neutro Abs: 4.5 10*3/uL (ref 1.4–7.7)
Neutrophils Relative %: 67.6 % (ref 43.0–77.0)
Platelets: 253 10*3/uL (ref 150.0–400.0)
RBC: 4.14 Mil/uL (ref 3.87–5.11)
RDW: 12.8 % (ref 11.5–15.5)
WBC: 6.7 10*3/uL (ref 4.0–10.5)

## 2019-11-01 LAB — COMPREHENSIVE METABOLIC PANEL
ALT: 16 U/L (ref 0–35)
AST: 23 U/L (ref 0–37)
Albumin: 3.8 g/dL (ref 3.5–5.2)
Alkaline Phosphatase: 114 U/L (ref 39–117)
BUN: 15 mg/dL (ref 6–23)
CO2: 30 mEq/L (ref 19–32)
Calcium: 9.1 mg/dL (ref 8.4–10.5)
Chloride: 104 mEq/L (ref 96–112)
Creatinine, Ser: 0.85 mg/dL (ref 0.40–1.20)
GFR: 65.34 mL/min (ref >60.00)
Glucose, Bld: 81 mg/dL (ref 70–99)
Potassium: 4.1 mEq/L (ref 3.5–5.1)
Sodium: 142 mEq/L (ref 135–145)
Total Bilirubin: 0.4 mg/dL (ref 0.2–1.2)
Total Protein: 6.2 g/dL (ref 6.0–8.3)

## 2019-11-01 LAB — LIPID PANEL
Cholesterol: 216 mg/dL — ABNORMAL HIGH (ref 0–200)
HDL: 90.2 mg/dL (ref >39.00)
LDL Cholesterol: 107 mg/dL — ABNORMAL HIGH (ref 0–99)
NonHDL: 126.22
Total CHOL/HDL Ratio: 2
Triglycerides: 94 mg/dL (ref 0.0–149.0)
VLDL: 18.8 mg/dL (ref 0.0–40.0)

## 2019-11-01 LAB — VITAMIN B12: Vitamin B-12: 369 pg/mL (ref 211–911)

## 2019-11-01 LAB — TSH: TSH: 3.1 u[IU]/mL (ref 0.35–4.50)

## 2019-11-12 ENCOUNTER — Telehealth: Payer: Self-pay

## 2019-11-12 DIAGNOSIS — Z111 Encounter for screening for respiratory tuberculosis: Secondary | ICD-10-CM

## 2019-11-12 NOTE — Telephone Encounter (Signed)
FYI, Patient's son, Rob Youth worker, called to inquire about FL2 forms. He states the patient has gotten worse wandering the neighborhood and getting lost as well as locking herself out of the house. He has reached out to two different places and is awaiting a response for placement.

## 2019-11-13 ENCOUNTER — Telehealth: Payer: Self-pay | Admitting: Physician Assistant

## 2019-11-13 NOTE — Telephone Encounter (Signed)
Marrion Coy called [phone] concerning his mom - Patricia Maynard - Please call

## 2019-11-13 NOTE — Telephone Encounter (Signed)
I have completed the FL2 form. They need to get placement so we can fax over to the facility.

## 2019-11-13 NOTE — Telephone Encounter (Signed)
Left a vm message for the patient's son, Marrion Coy, to call the office back in reference to [facility] forms.

## 2019-11-13 NOTE — Telephone Encounter (Signed)
Left a vm message for the patient's son to call the office back.

## 2019-11-14 NOTE — Telephone Encounter (Signed)
Made in error

## 2019-11-14 NOTE — Telephone Encounter (Signed)
Spoke with patient's son, Marrion Coy. The name of the nursing home is: [facility]. Address: [address] [place] [address]. Phone # [phone], Fax# [phone]. ATTNAddison Bailey. Patient's son also needs an order for a chest xray to rule out TB. Chest xray needs to be performed within 48 hours of patient being admitted to nursing home.

## 2019-11-14 NOTE — Telephone Encounter (Signed)
Please advise 

## 2019-11-14 NOTE — Telephone Encounter (Signed)
Spoke with Marrion Coy, patient's son. He will call back this afternoon with the information on where to fax the Fl2 form and who to attention it too.

## 2019-11-15 NOTE — Telephone Encounter (Signed)
CXR ordered and FL2 form faxed to facility

## 2019-11-15 NOTE — Addendum Note (Signed)
Addended by: Con Memos on: 11/15/2019 09:55 AM   Modules accepted: Orders

## 2019-11-15 NOTE — Telephone Encounter (Signed)
Spoke with the patient's son, Marrion Coy and informed him that orders for chest xray were put into the system. He is aware and will be taking Mrs. Nack to [facility] to get her xray done.

## 2019-11-15 NOTE — Telephone Encounter (Signed)
Ok to place order for CXR -- can be done at [facility] or [facility], whichever is more convenient for patient and family.  Ok to fax completed FL2 forms to the requested facility.

## 2019-11-16 ENCOUNTER — Other Ambulatory Visit: Payer: Self-pay

## 2019-11-16 ENCOUNTER — Ambulatory Visit (INDEPENDENT_AMBULATORY_CARE_PROVIDER_SITE_OTHER)
Admission: RE | Admit: 2019-11-16 | Discharge: 2019-11-16 | Disposition: A | Discharge status: Home / Self Care | Payer: Medicare Other | Source: Ambulatory Visit | Attending: Physician Assistant | Admitting: Physician Assistant

## 2019-11-16 ENCOUNTER — Telehealth: Payer: Self-pay

## 2019-11-16 DIAGNOSIS — Z0289 Encounter for other administrative examinations: Secondary | ICD-10-CM | POA: Diagnosis not present

## 2019-11-16 DIAGNOSIS — J449 Chronic obstructive pulmonary disease, unspecified: Secondary | ICD-10-CM | POA: Diagnosis not present

## 2019-11-16 DIAGNOSIS — Z111 Encounter for screening for respiratory tuberculosis: Secondary | ICD-10-CM

## 2019-11-16 NOTE — Telephone Encounter (Signed)
Took call from patient's son, Marrion Coy. He states he has taken his mother to get the chest xray and everything went good. He just needs for it to be faxed to [phone] Attn: Ocie Cornfield. He states it must say Bilateral reading, patient is free of tuberculosis. He states he is trying to get her into the home by Tuesday.

## 2019-11-19 ENCOUNTER — Ambulatory Visit: Payer: Medicare Other | Admitting: Neurology

## 2019-11-19 NOTE — Telephone Encounter (Signed)
Called the facility at [phone] to speak with Patricia Maynard to make sure he received the chest xray. I was informed that he was not in the office today and was transferred to [employer], the resident intake coordinator. I was told that this information would have gone to her. Left a vm message for her to call back and let me know that they did receive the xray.

## 2019-11-19 NOTE — Telephone Encounter (Signed)
Pt called in checking the status to on the results for the TB and the chest xray. He states that he is trying to get his mom into a nursing home as soon as tomorrow. Please call [phone] with an update.

## 2019-11-19 NOTE — Telephone Encounter (Signed)
Spoke with the patient's son, he will continue to reach out to the SNF to make sure they did receive the xray.

## 2019-11-19 NOTE — Telephone Encounter (Signed)
Chest xray results faxed to Ocie Cornfield at the home

## 2019-11-19 NOTE — Telephone Encounter (Signed)
Results are in -- see result note. Please fax to requested number.

## 2019-11-21 DIAGNOSIS — F411 Generalized anxiety disorder: Secondary | ICD-10-CM | POA: Diagnosis not present

## 2019-11-21 DIAGNOSIS — B372 Candidiasis of skin and nail: Secondary | ICD-10-CM | POA: Diagnosis not present

## 2019-11-21 DIAGNOSIS — R030 Elevated blood-pressure reading, without diagnosis of hypertension: Secondary | ICD-10-CM | POA: Diagnosis not present

## 2019-11-21 DIAGNOSIS — R600 Localized edema: Secondary | ICD-10-CM | POA: Diagnosis not present

## 2019-11-21 DIAGNOSIS — G301 Alzheimer's disease with late onset: Secondary | ICD-10-CM | POA: Diagnosis not present

## 2019-11-27 DIAGNOSIS — E119 Type 2 diabetes mellitus without complications: Secondary | ICD-10-CM | POA: Diagnosis not present

## 2019-11-27 DIAGNOSIS — Z79899 Other long term (current) drug therapy: Secondary | ICD-10-CM | POA: Diagnosis not present

## 2019-11-27 DIAGNOSIS — D518 Other vitamin B12 deficiency anemias: Secondary | ICD-10-CM | POA: Diagnosis not present

## 2019-11-27 DIAGNOSIS — E038 Other specified hypothyroidism: Secondary | ICD-10-CM | POA: Diagnosis not present

## 2019-11-27 DIAGNOSIS — E559 Vitamin D deficiency, unspecified: Secondary | ICD-10-CM | POA: Diagnosis not present

## 2019-11-27 DIAGNOSIS — E7849 Other hyperlipidemia: Secondary | ICD-10-CM | POA: Diagnosis not present

## 2019-12-07 DIAGNOSIS — F0281 Dementia in other diseases classified elsewhere with behavioral disturbance: Secondary | ICD-10-CM | POA: Diagnosis not present

## 2019-12-07 DIAGNOSIS — G301 Alzheimer's disease with late onset: Secondary | ICD-10-CM | POA: Diagnosis not present

## 2019-12-13 DIAGNOSIS — G301 Alzheimer's disease with late onset: Secondary | ICD-10-CM | POA: Diagnosis not present

## 2019-12-13 DIAGNOSIS — B372 Candidiasis of skin and nail: Secondary | ICD-10-CM | POA: Diagnosis not present

## 2019-12-13 DIAGNOSIS — I10 Essential (primary) hypertension: Secondary | ICD-10-CM | POA: Diagnosis not present

## 2019-12-13 DIAGNOSIS — F411 Generalized anxiety disorder: Secondary | ICD-10-CM | POA: Diagnosis not present

## 2019-12-26 ENCOUNTER — Telehealth: Payer: Self-pay | Admitting: Physician Assistant

## 2019-12-26 NOTE — Telephone Encounter (Signed)
Spoke with patient's son, he stated she was in a nursing facility.

## 2019-12-27 DIAGNOSIS — I10 Essential (primary) hypertension: Secondary | ICD-10-CM | POA: Diagnosis not present

## 2019-12-27 DIAGNOSIS — M79674 Pain in right toe(s): Secondary | ICD-10-CM | POA: Diagnosis not present

## 2019-12-27 DIAGNOSIS — F411 Generalized anxiety disorder: Secondary | ICD-10-CM | POA: Diagnosis not present

## 2019-12-27 DIAGNOSIS — B351 Tinea unguium: Secondary | ICD-10-CM | POA: Diagnosis not present

## 2019-12-27 DIAGNOSIS — M79675 Pain in left toe(s): Secondary | ICD-10-CM | POA: Diagnosis not present

## 2019-12-27 DIAGNOSIS — G301 Alzheimer's disease with late onset: Secondary | ICD-10-CM | POA: Diagnosis not present

## 2019-12-27 DIAGNOSIS — F0281 Dementia in other diseases classified elsewhere with behavioral disturbance: Secondary | ICD-10-CM | POA: Diagnosis not present

## 2020-01-31 DIAGNOSIS — I10 Essential (primary) hypertension: Secondary | ICD-10-CM | POA: Diagnosis not present

## 2020-01-31 DIAGNOSIS — G4700 Insomnia, unspecified: Secondary | ICD-10-CM | POA: Diagnosis not present

## 2020-01-31 DIAGNOSIS — G301 Alzheimer's disease with late onset: Secondary | ICD-10-CM | POA: Diagnosis not present

## 2020-02-28 DIAGNOSIS — M79675 Pain in left toe(s): Secondary | ICD-10-CM | POA: Diagnosis not present

## 2020-02-28 DIAGNOSIS — B351 Tinea unguium: Secondary | ICD-10-CM | POA: Diagnosis not present

## 2020-02-28 DIAGNOSIS — M79674 Pain in right toe(s): Secondary | ICD-10-CM | POA: Diagnosis not present

## 2020-03-06 DIAGNOSIS — E119 Type 2 diabetes mellitus without complications: Secondary | ICD-10-CM | POA: Diagnosis not present

## 2020-03-06 DIAGNOSIS — G301 Alzheimer's disease with late onset: Secondary | ICD-10-CM | POA: Diagnosis not present

## 2020-03-06 DIAGNOSIS — E038 Other specified hypothyroidism: Secondary | ICD-10-CM | POA: Diagnosis not present

## 2020-03-06 DIAGNOSIS — E559 Vitamin D deficiency, unspecified: Secondary | ICD-10-CM | POA: Diagnosis not present

## 2020-03-06 DIAGNOSIS — F0281 Dementia in other diseases classified elsewhere with behavioral disturbance: Secondary | ICD-10-CM | POA: Diagnosis not present

## 2020-03-06 DIAGNOSIS — I10 Essential (primary) hypertension: Secondary | ICD-10-CM | POA: Diagnosis not present

## 2020-03-06 DIAGNOSIS — D518 Other vitamin B12 deficiency anemias: Secondary | ICD-10-CM | POA: Diagnosis not present

## 2020-03-09 DIAGNOSIS — R55 Syncope and collapse: Secondary | ICD-10-CM | POA: Diagnosis not present

## 2020-03-09 DIAGNOSIS — M47812 Spondylosis without myelopathy or radiculopathy, cervical region: Secondary | ICD-10-CM | POA: Diagnosis not present

## 2020-03-09 DIAGNOSIS — I491 Atrial premature depolarization: Secondary | ICD-10-CM | POA: Diagnosis not present

## 2020-03-09 DIAGNOSIS — M2550 Pain in unspecified joint: Secondary | ICD-10-CM | POA: Diagnosis not present

## 2020-03-09 DIAGNOSIS — G9389 Other specified disorders of brain: Secondary | ICD-10-CM | POA: Diagnosis not present

## 2020-03-09 DIAGNOSIS — J9601 Acute respiratory failure with hypoxia: Secondary | ICD-10-CM | POA: Diagnosis not present

## 2020-03-09 DIAGNOSIS — R531 Weakness: Secondary | ICD-10-CM | POA: Diagnosis not present

## 2020-03-09 DIAGNOSIS — R404 Transient alteration of awareness: Secondary | ICD-10-CM | POA: Diagnosis not present

## 2020-03-09 DIAGNOSIS — R0902 Hypoxemia: Secondary | ICD-10-CM | POA: Diagnosis not present

## 2020-03-09 DIAGNOSIS — R000 Tachycardia, unspecified: Secondary | ICD-10-CM | POA: Diagnosis not present

## 2020-03-09 DIAGNOSIS — Z79899 Other long term (current) drug therapy: Secondary | ICD-10-CM | POA: Diagnosis not present

## 2020-03-09 DIAGNOSIS — J1282 Pneumonia due to coronavirus disease 2019: Secondary | ICD-10-CM | POA: Diagnosis not present

## 2020-03-09 DIAGNOSIS — S199XXA Unspecified injury of neck, initial encounter: Secondary | ICD-10-CM | POA: Diagnosis not present

## 2020-03-09 DIAGNOSIS — R4020 Unspecified coma: Secondary | ICD-10-CM | POA: Diagnosis not present

## 2020-03-09 DIAGNOSIS — R4182 Altered mental status, unspecified: Secondary | ICD-10-CM | POA: Diagnosis not present

## 2020-03-09 DIAGNOSIS — W1839XA Other fall on same level, initial encounter: Secondary | ICD-10-CM | POA: Diagnosis not present

## 2020-03-09 DIAGNOSIS — R1310 Dysphagia, unspecified: Secondary | ICD-10-CM | POA: Diagnosis not present

## 2020-03-09 DIAGNOSIS — J9811 Atelectasis: Secondary | ICD-10-CM | POA: Diagnosis not present

## 2020-03-09 DIAGNOSIS — U071 COVID-19: Secondary | ICD-10-CM | POA: Diagnosis not present

## 2020-03-09 DIAGNOSIS — E785 Hyperlipidemia, unspecified: Secondary | ICD-10-CM | POA: Diagnosis not present

## 2020-03-09 DIAGNOSIS — I10 Essential (primary) hypertension: Secondary | ICD-10-CM | POA: Diagnosis not present

## 2020-03-09 DIAGNOSIS — Z66 Do not resuscitate: Secondary | ICD-10-CM | POA: Diagnosis not present

## 2020-03-09 DIAGNOSIS — I6782 Cerebral ischemia: Secondary | ICD-10-CM | POA: Diagnosis not present

## 2020-03-09 DIAGNOSIS — S0990XA Unspecified injury of head, initial encounter: Secondary | ICD-10-CM | POA: Diagnosis not present

## 2020-03-09 DIAGNOSIS — Y998 Other external cause status: Secondary | ICD-10-CM | POA: Diagnosis not present

## 2020-03-09 DIAGNOSIS — F0390 Unspecified dementia without behavioral disturbance: Secondary | ICD-10-CM | POA: Diagnosis not present

## 2020-03-09 DIAGNOSIS — W19XXXA Unspecified fall, initial encounter: Secondary | ICD-10-CM | POA: Diagnosis not present

## 2020-03-09 DIAGNOSIS — R0602 Shortness of breath: Secondary | ICD-10-CM | POA: Diagnosis not present

## 2020-03-09 DIAGNOSIS — Z9981 Dependence on supplemental oxygen: Secondary | ICD-10-CM | POA: Diagnosis not present

## 2020-03-09 DIAGNOSIS — F419 Anxiety disorder, unspecified: Secondary | ICD-10-CM | POA: Diagnosis not present

## 2020-03-09 DIAGNOSIS — Z7401 Bed confinement status: Secondary | ICD-10-CM | POA: Diagnosis not present

## 2020-03-09 DIAGNOSIS — J189 Pneumonia, unspecified organism: Secondary | ICD-10-CM | POA: Diagnosis not present

## 2020-03-17 DIAGNOSIS — R5381 Other malaise: Secondary | ICD-10-CM | POA: Diagnosis not present

## 2020-03-17 DIAGNOSIS — J1282 Pneumonia due to coronavirus disease 2019: Secondary | ICD-10-CM | POA: Diagnosis not present

## 2020-03-17 DIAGNOSIS — U071 COVID-19: Secondary | ICD-10-CM | POA: Diagnosis not present

## 2020-03-17 DIAGNOSIS — G301 Alzheimer's disease with late onset: Secondary | ICD-10-CM | POA: Diagnosis not present

## 2020-03-17 DIAGNOSIS — I10 Essential (primary) hypertension: Secondary | ICD-10-CM | POA: Diagnosis not present

## 2020-03-19 DIAGNOSIS — U071 COVID-19: Secondary | ICD-10-CM | POA: Diagnosis not present

## 2020-03-28 DIAGNOSIS — F0281 Dementia in other diseases classified elsewhere with behavioral disturbance: Secondary | ICD-10-CM | POA: Diagnosis not present

## 2020-03-28 DIAGNOSIS — E038 Other specified hypothyroidism: Secondary | ICD-10-CM | POA: Diagnosis not present

## 2020-03-28 DIAGNOSIS — G301 Alzheimer's disease with late onset: Secondary | ICD-10-CM | POA: Diagnosis not present

## 2020-03-28 DIAGNOSIS — I10 Essential (primary) hypertension: Secondary | ICD-10-CM | POA: Diagnosis not present

## 2020-03-28 DIAGNOSIS — D518 Other vitamin B12 deficiency anemias: Secondary | ICD-10-CM | POA: Diagnosis not present

## 2020-03-28 DIAGNOSIS — E119 Type 2 diabetes mellitus without complications: Secondary | ICD-10-CM | POA: Diagnosis not present

## 2020-03-28 DIAGNOSIS — E559 Vitamin D deficiency, unspecified: Secondary | ICD-10-CM | POA: Diagnosis not present

## 2020-04-08 DIAGNOSIS — Z743 Need for continuous supervision: Secondary | ICD-10-CM | POA: Diagnosis not present

## 2020-04-08 DIAGNOSIS — R400 Somnolence: Secondary | ICD-10-CM | POA: Diagnosis not present

## 2020-04-08 DIAGNOSIS — R404 Transient alteration of awareness: Secondary | ICD-10-CM | POA: Diagnosis not present

## 2020-04-08 DIAGNOSIS — R569 Unspecified convulsions: Secondary | ICD-10-CM | POA: Diagnosis not present

## 2020-04-08 DIAGNOSIS — R0902 Hypoxemia: Secondary | ICD-10-CM | POA: Diagnosis not present

## 2020-04-08 DIAGNOSIS — R279 Unspecified lack of coordination: Secondary | ICD-10-CM | POA: Diagnosis not present

## 2020-04-15 DIAGNOSIS — F0281 Dementia in other diseases classified elsewhere with behavioral disturbance: Secondary | ICD-10-CM | POA: Diagnosis not present

## 2020-04-15 DIAGNOSIS — G301 Alzheimer's disease with late onset: Secondary | ICD-10-CM | POA: Diagnosis not present

## 2020-04-15 DIAGNOSIS — I10 Essential (primary) hypertension: Secondary | ICD-10-CM | POA: Diagnosis not present

## 2020-04-15 DIAGNOSIS — G4700 Insomnia, unspecified: Secondary | ICD-10-CM | POA: Diagnosis not present

## 2020-09-13 IMAGING — DX DG CHEST 2V
2 series · 2 of 2 positions shown · non-contrast
Comparison: None.

CLINICAL DATA: [HOSPITAL] placement

EXAM:
CHEST - 2 VIEW

[chest pa]
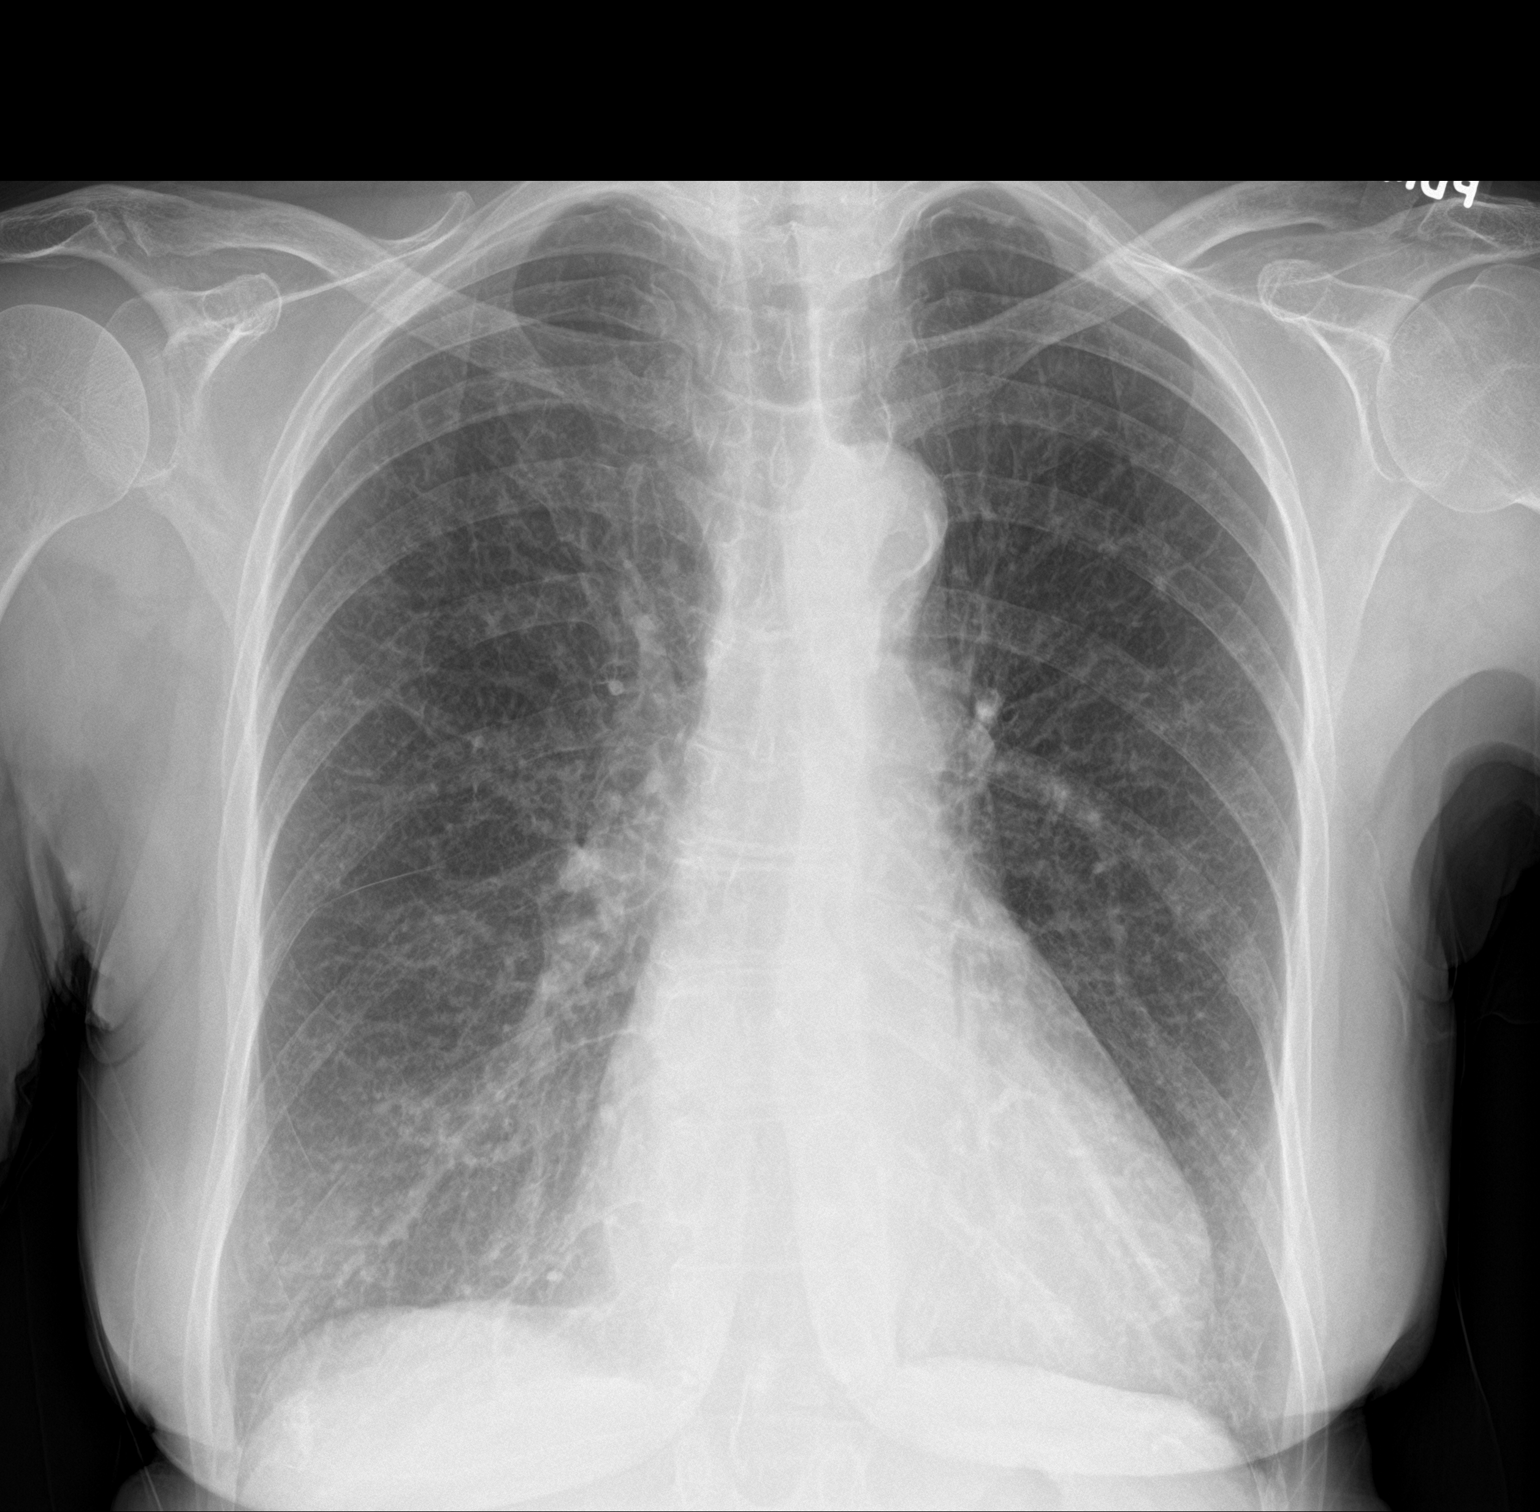

[chest lat]
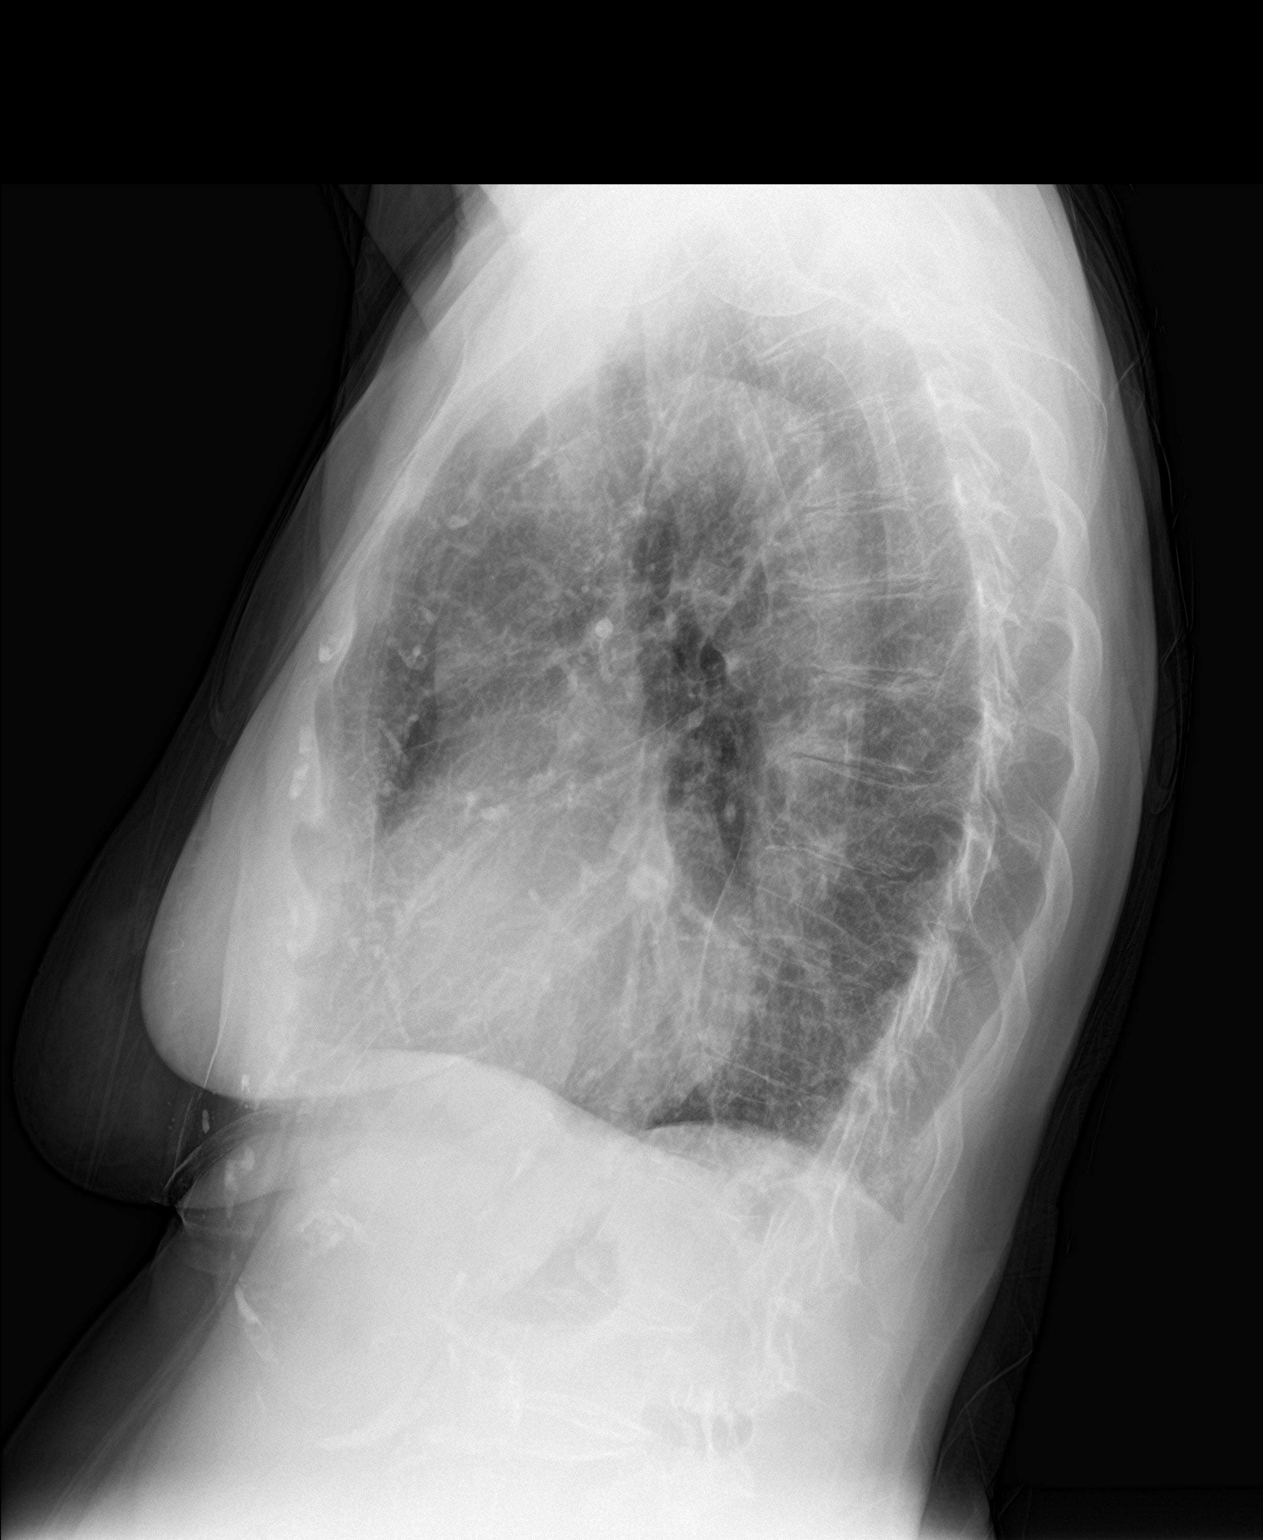

[2 of 2 positions shown; findings below may reference images not displayed]

FINDINGS: Cardiac shadow is within normal limits. Aortic calcifications are
seen. The lungs are hyperinflated bilaterally. Mild interstitial
changes are noted likely of a chronic nature. No acute infiltrate is
seen. No bony abnormality is noted.
IMPRESSION: COPD without acute abnormality.

## 2023-07-19 DEATH — deceased
